# Patient Record
Sex: Female | Born: 1955 | Race: White | Hispanic: No | Marital: Single | Smoking: Former smoker
Health system: Southern US, Community
[De-identification: ages and names within clinical notes are randomized; demographics above are authoritative.]

## PROBLEM LIST (undated history)

## (undated) DIAGNOSIS — E559 Vitamin D deficiency, unspecified: Secondary | ICD-10-CM

## (undated) DIAGNOSIS — R918 Other nonspecific abnormal finding of lung field: Secondary | ICD-10-CM

## (undated) DIAGNOSIS — E785 Hyperlipidemia, unspecified: Secondary | ICD-10-CM

## (undated) DIAGNOSIS — M25511 Pain in right shoulder: Secondary | ICD-10-CM

## (undated) DIAGNOSIS — I10 Essential (primary) hypertension: Secondary | ICD-10-CM

## (undated) DIAGNOSIS — B009 Herpesviral infection, unspecified: Secondary | ICD-10-CM

## (undated) DIAGNOSIS — M7551 Bursitis of right shoulder: Secondary | ICD-10-CM

## (undated) DIAGNOSIS — M7541 Impingement syndrome of right shoulder: Secondary | ICD-10-CM

## (undated) DIAGNOSIS — G5601 Carpal tunnel syndrome, right upper limb: Secondary | ICD-10-CM

## (undated) DIAGNOSIS — F17210 Nicotine dependence, cigarettes, uncomplicated: Secondary | ICD-10-CM

## (undated) DIAGNOSIS — M1711 Unilateral primary osteoarthritis, right knee: Secondary | ICD-10-CM

## (undated) DIAGNOSIS — G2581 Restless legs syndrome: Secondary | ICD-10-CM

## (undated) DIAGNOSIS — G4700 Insomnia, unspecified: Secondary | ICD-10-CM

## (undated) DIAGNOSIS — M19011 Primary osteoarthritis, right shoulder: Secondary | ICD-10-CM

## (undated) DIAGNOSIS — I2510 Atherosclerotic heart disease of native coronary artery without angina pectoris: Secondary | ICD-10-CM

## (undated) DIAGNOSIS — R768 Other specified abnormal immunological findings in serum: Secondary | ICD-10-CM

## (undated) DIAGNOSIS — M75101 Unspecified rotator cuff tear or rupture of right shoulder, not specified as traumatic: Secondary | ICD-10-CM

## (undated) DIAGNOSIS — K635 Polyp of colon: Secondary | ICD-10-CM

## (undated) HISTORY — DX: Unilateral primary osteoarthritis, right knee: M17.11

## (undated) HISTORY — DX: Restless legs syndrome: G25.81

## (undated) HISTORY — DX: Insomnia, unspecified: G47.00

## (undated) HISTORY — PX: MOUTH SURGERY: SHX715

## (undated) HISTORY — DX: Vitamin D deficiency, unspecified: E55.9

## (undated) HISTORY — DX: Primary osteoarthritis, right shoulder: M19.011

## (undated) HISTORY — DX: Carpal tunnel syndrome, right upper limb: G56.01

## (undated) HISTORY — DX: Herpesviral infection, unspecified: B00.9

## (undated) HISTORY — DX: Impingement syndrome of right shoulder: M75.41

## (undated) HISTORY — DX: Pain in right shoulder: M25.511

## (undated) HISTORY — DX: Polyp of colon: K63.5

## (undated) HISTORY — DX: Bursitis of right shoulder: M75.51

## (undated) HISTORY — PX: ABDOMINAL HYSTERECTOMY: SHX81

## (undated) HISTORY — DX: Hyperlipidemia, unspecified: E78.5

## (undated) HISTORY — DX: Essential (primary) hypertension: I10

## (undated) HISTORY — DX: Nicotine dependence, cigarettes, uncomplicated: F17.210

## (undated) HISTORY — DX: Atherosclerotic heart disease of native coronary artery without angina pectoris: I25.10

## (undated) HISTORY — DX: Other nonspecific abnormal finding of lung field: R91.8

## (undated) HISTORY — DX: Other specified abnormal immunological findings in serum: R76.8

## (undated) HISTORY — DX: Unspecified rotator cuff tear or rupture of right shoulder, not specified as traumatic: M75.101

---

## 2016-04-11 ENCOUNTER — Other Ambulatory Visit: Payer: Self-pay | Admitting: Orthopedic Surgery

## 2016-04-11 DIAGNOSIS — M7541 Impingement syndrome of right shoulder: Secondary | ICD-10-CM

## 2016-04-16 ENCOUNTER — Encounter: Payer: Self-pay | Admitting: Radiology

## 2016-04-16 ENCOUNTER — Ambulatory Visit
Admission: RE | Admit: 2016-04-16 | Discharge: 2016-04-16 | Disposition: A | Discharge status: Home / Self Care | Payer: BLUE CROSS/BLUE SHIELD | Source: Ambulatory Visit | Attending: Orthopedic Surgery | Admitting: Orthopedic Surgery

## 2016-04-16 DIAGNOSIS — M7541 Impingement syndrome of right shoulder: Secondary | ICD-10-CM

## 2017-04-19 IMAGING — MR MR SHOULDER*R* W/O CM
4 of 5 series · 19 of 40 positions shown · non-contrast
Comparison: None.

CLINICAL DATA: Right shoulder pain, weakness

EXAM:
MRI OF THE RIGHT SHOULDER WITHOUT CONTRAST
TECHNIQUE: Multiplanar, multisequence MR imaging of the shoulder was performed.
No intravenous contrast was administered.

[Series 3: T2 fat-sat · axial · 4.0mm · 0.27mm/px · z∈[+2,+56]mm · 5 of 16 slices shown (1 of 3)]
[im 1/16]
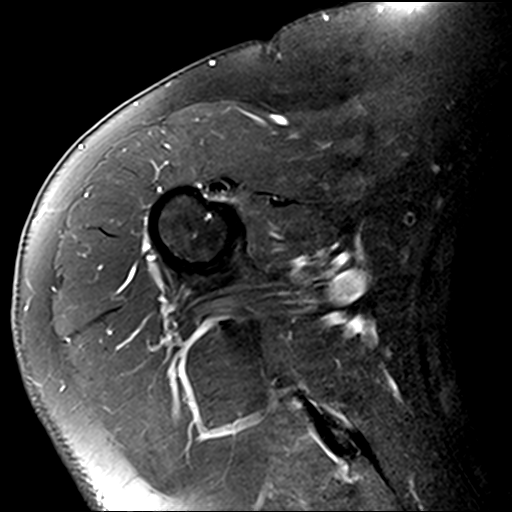
[im 3/16]
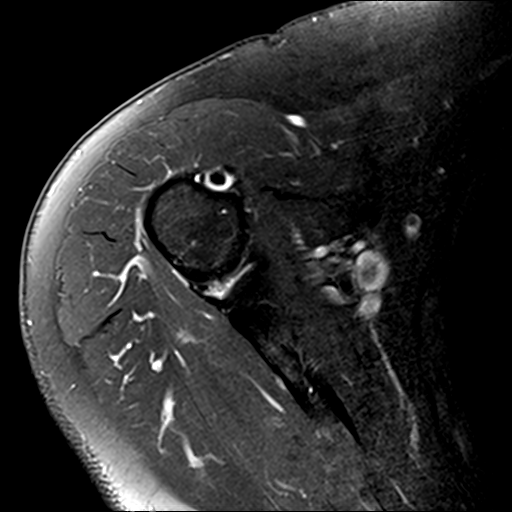
[im 5/16]
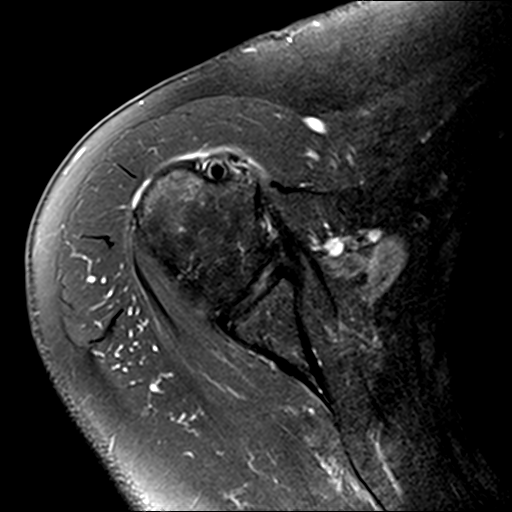
[im 9/16]
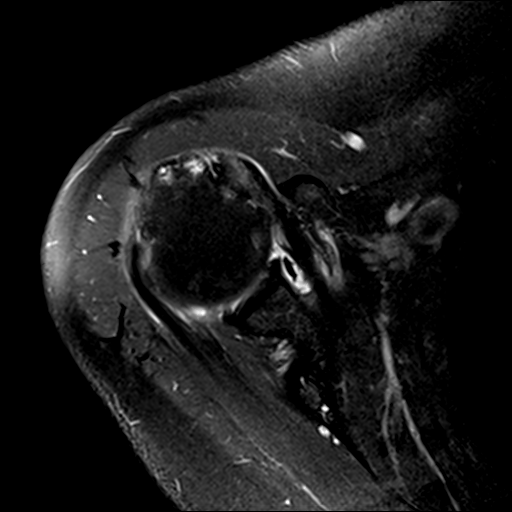
[im 13/16]
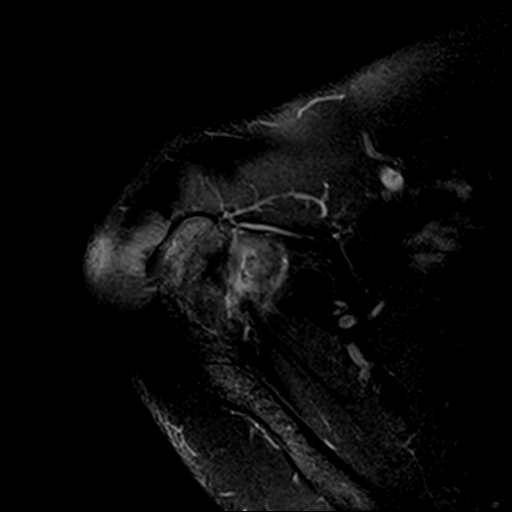

[Series 4: T2 fat-sat · oblique · 4.0mm · 0.31mm/px · 3 of 16 slices shown (2 of 3)]
[im 3/16]
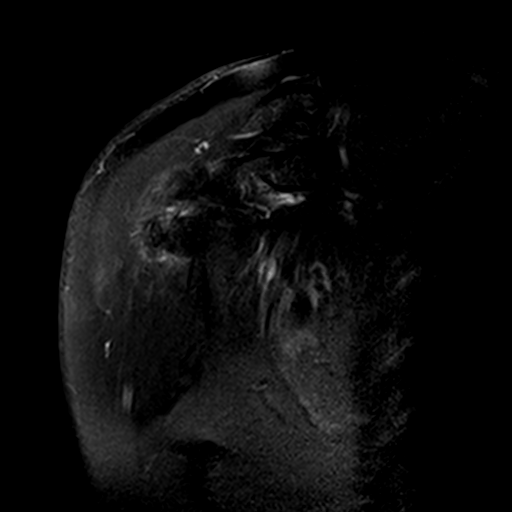
[im 9/16]
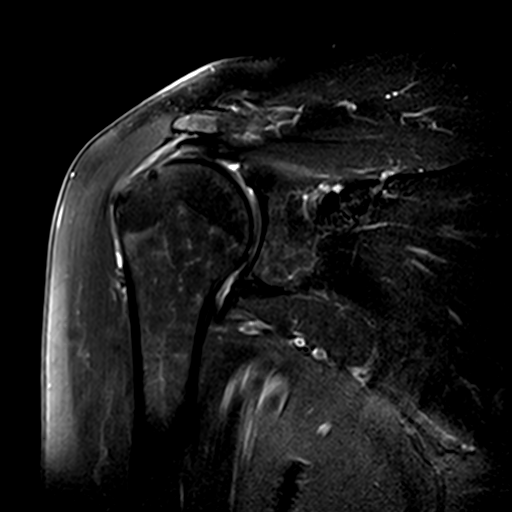
[im 13/16]
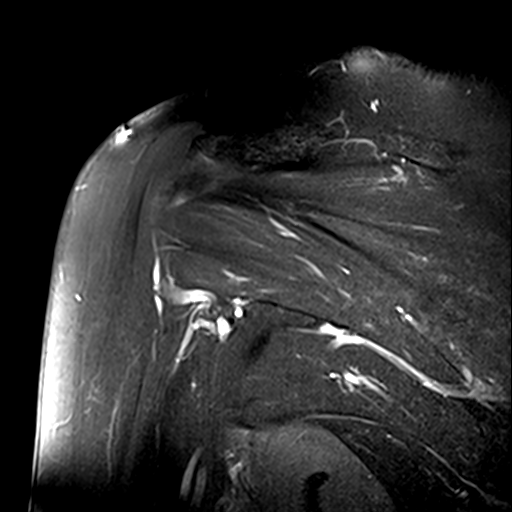

[Series 5: T2 fat-sat · coronal · 4.0mm · 0.31mm/px · 3 of 15 slices shown (3 of 3)]
[im 3/15]
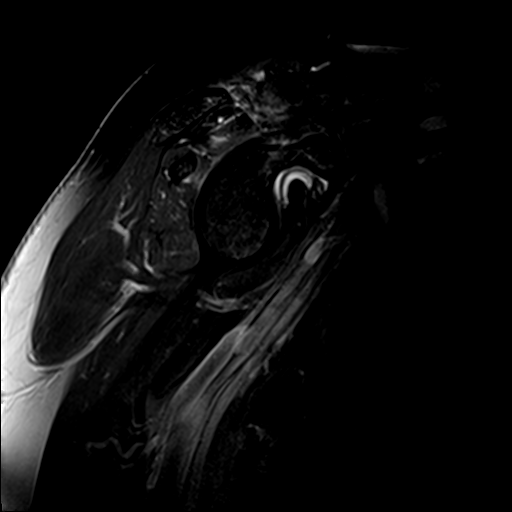
[im 9/15]
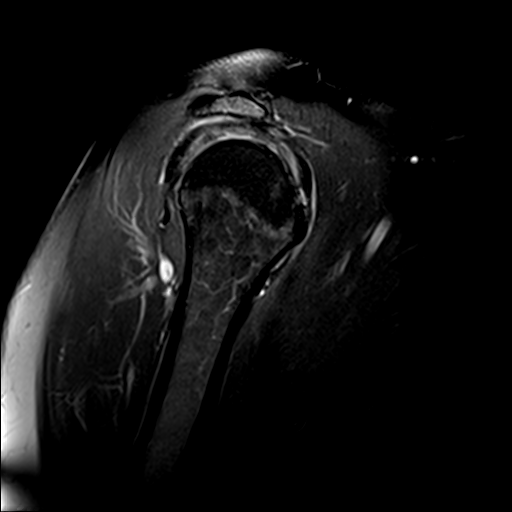
[im 13/15]
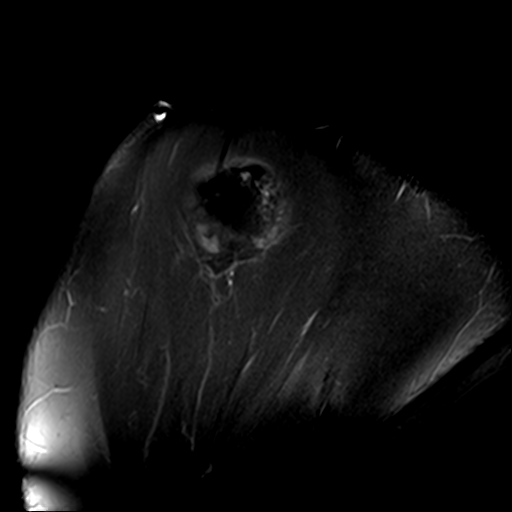

[Series 7: PD · oblique · 4.0mm · 0.27mm/px · 8 of 16 slices shown]
[im 1/16]
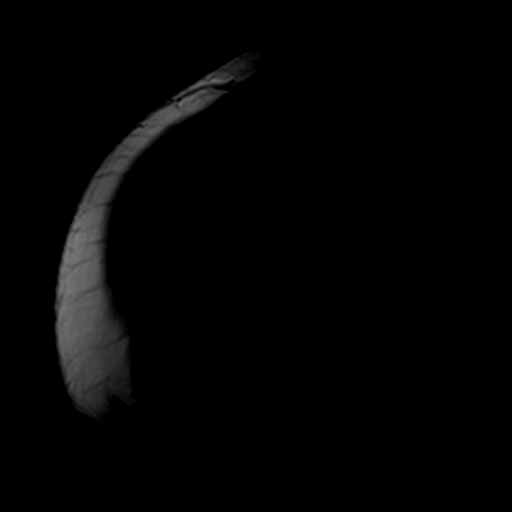
[im 3/16]
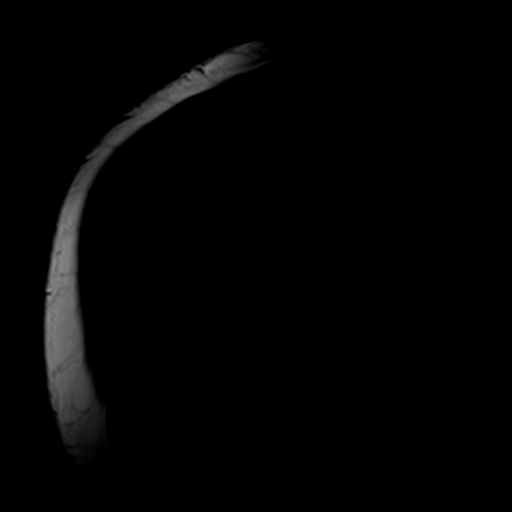
[im 5/16]
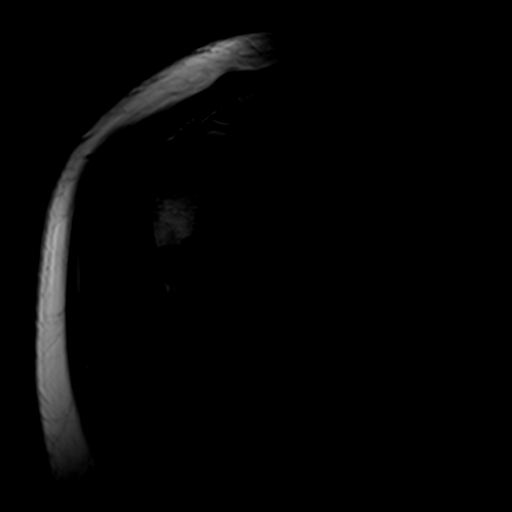
[im 7/16]
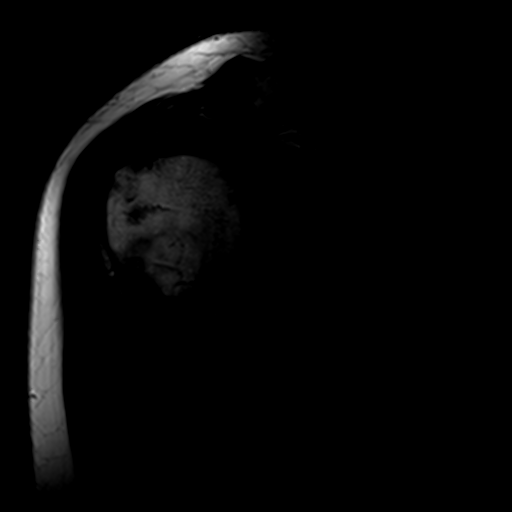
[im 9/16]
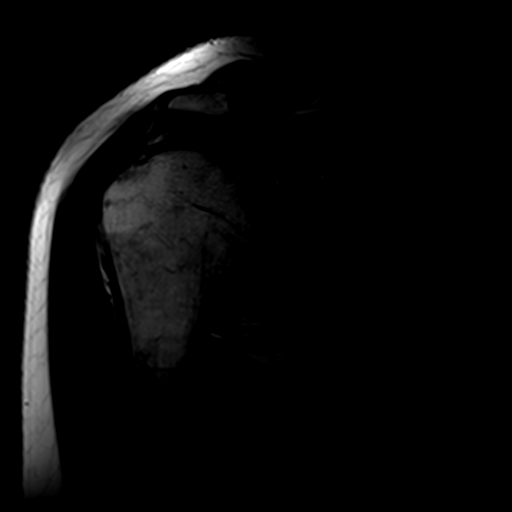
[im 11/16]
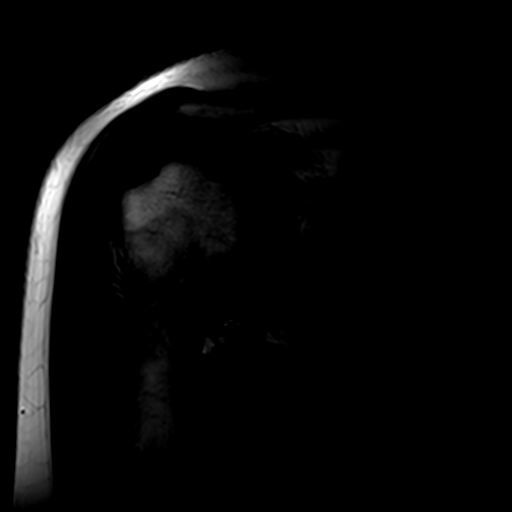
[im 13/16]
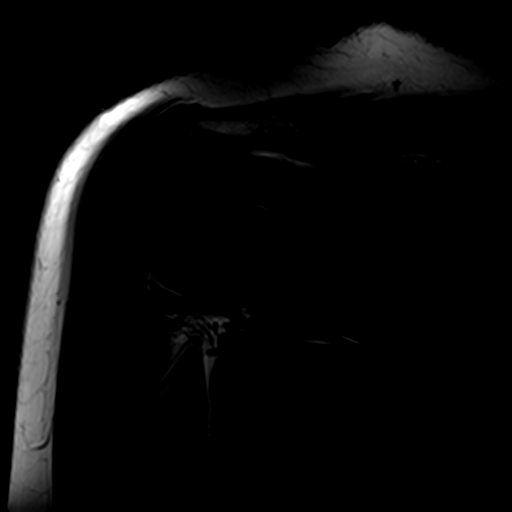
[im 16/16]
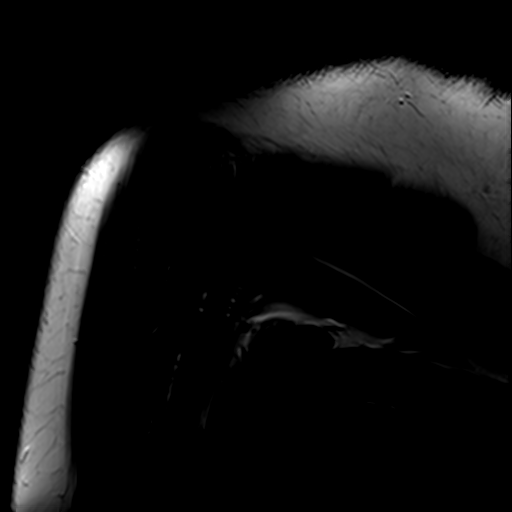

[19 of 40 positions shown; findings below may reference images not displayed]

FINDINGS: Rotator cuff: Moderate tendinosis of the supraspinatus tendon with a
large full-thickness tear measuring 2 cm in anterior- posterior
dimension with 15 mm of retraction and a few intact anterior fibers.
Moderate tendinosis of the infraspinatus tendon. Teres minor tendon
is intact. Mild tendinosis of the subscapularis tendon.

Muscles: No atrophy or fatty replacement of nor abnormal signal
within, the muscles of the rotator cuff.

Biceps long head: Moderate tendinosis of the intraarticular portion
of the long head of the biceps tendon.

Acromioclavicular Joint: Moderate arthropathy of the
acromioclavicular joint. Type I acromion.

Glenohumeral Joint: No joint effusion.  No focal chondral defect.

Labrum: Grossly intact, but evaluation is limited by lack of
intraarticular fluid.

Bones:  No marrow signal abnormality.  No fracture or dislocation.

Other: No fluid collection or hematoma.
IMPRESSION: 1. Moderate tendinosis of the supraspinatus tendon with a large
full-thickness tear measuring 2 cm in anterior- posterior dimension
with 15 mm of retraction and a few intact anterior fibers.
2. Moderate tendinosis of the infraspinatus tendon.
3. Mild tendinosis of the subscapularis tendon.
4. Moderate tendinosis of the intraarticular portion of the long
head of the biceps tendon.

## 2019-05-20 ENCOUNTER — Telehealth: Payer: Self-pay

## 2019-05-20 NOTE — Telephone Encounter (Signed)
NOTES ON FILE FROM [facility] [phone], NOTES SENT TO SCHEDULING

## 2019-06-11 NOTE — Progress Notes (Signed)
CARDIOLOGY CONSULT NOTE       Patient ID: Rachel Cooper MRN: [id number] DOB/AGE: 1955-12-17 64 y.o.  Admit date: (Not on file) Referring Physician: Self for 2 nd opinion  Primary Physician: No primary care provider on file. Primary Cardiologist: New Reason for Consultation: CAD  Active Problems:   * No active hospital problems. *   HPI:  64 y.o. referred by self  for CAD. History of chronic pain syndrome with fibromyalgia and osteoarthritis She has HLD but statins makes her pains worse History of HTN Previous smoker quitting 2019 Seen by Cardiology at [facility] 03/31/19 Has had 2 vessel PCI LAD and circumflex 03/12/19  She has been on Plavix  LDL has been in 118 range There notes indicate 3 mm x 16 mm DES Synergy to LAD /Circumflex Cath done from right radial EF normal 55% with trivial MR and no gradient across AV Ulcerative 80% plaques in proximal LAD and mid circumflex directly stented  Notes indicate only 30% residual distal RCA and LAD disease   History of pulmonary nodules first diagnosed in 2017 and stable by CT She has again held her statin ( simvastatin) to see if her back pain improved and it did not   LDL too high at 131 Triglycerides 163   Initial presentation was with bad indigestion and transferred to [facility] from [place] [facility] No recurrent symptoms since stents. Most concerned about all the meds she is taking especially DAT as she bruises easily Assured her they were all needed and discussed risk of stent thrombosis If she does not comply   ROS All other systems reviewed and negative except as noted above  Past Medical History:  Diagnosis Date  . ANA positive   . Arthritis of right acromioclavicular joint   . Bursitis of right shoulder   . CAD (coronary artery disease)   . Cigarette nicotine dependence   . Colon polyp   . Herpes simplex   . Hyperlipidemia   . Hypertension   . Insomnia   . Insomnia   . Osteoarthritis of right knee   . Pulmonary  nodules   . Right carpal tunnel syndrome   . Right shoulder pain   . RLS (restless legs syndrome)   . Shoulder impingement syndrome, right   . Tear of right rotator cuff   . Vitamin D insufficiency     Family History  Problem Relation Age of Onset  . Heart disease Mother   . Cancer Father        LUNG  . Arthritis Sister   . Fibromyalgia Sister   . Hypertension Sister     Social History   Socioeconomic History  . Marital status: Single    Spouse name: Not on file  . Number of children: Not on file  . Years of education: Not on file  . Highest education level: Not on file  Occupational History  . Not on file  Tobacco Use  . Smoking status: Former Smoker    Packs/day: 1.00    Years: 15.00    Pack years: 15.00    Types: Cigarettes  . Smokeless tobacco: Never Used  Substance and Sexual Activity  . Alcohol use: Never  . Drug use: Not on file  . Sexual activity: Not on file  Other Topics Concern  . Not on file  Social History Narrative  . Not on file   Social Determinants of Health   Financial Resource Strain:   . Difficulty of Paying Living Expenses: Not on  file  Food Insecurity:   . Worried About Programme researcher, broadcasting/film/video in the Last Year: Not on file  . Ran Out of Food in the Last Year: Not on file  Transportation Needs:   . Lack of Transportation (Medical): Not on file  . Lack of Transportation (Non-Medical): Not on file  Physical Activity:   . Days of Exercise per Week: Not on file  . Minutes of Exercise per Session: Not on file  Stress:   . Feeling of Stress : Not on file  Social Connections:   . Frequency of Communication with Friends and Family: Not on file  . Frequency of Social Gatherings with Friends and Family: Not on file  . Attends Religious Services: Not on file  . Active Member of Clubs or Organizations: Not on file  . Attends Banker Meetings: Not on file  . Marital Status: Not on file  Intimate Partner Violence:   . Fear of Current  or Ex-Partner: Not on file  . Emotionally Abused: Not on file  . Physically Abused: Not on file  . Sexually Abused: Not on file    Past Surgical History:  Procedure Laterality Date  . ABDOMINAL HYSTERECTOMY    . MOUTH SURGERY        Current Outpatient Medications:  .  aspirin EC 81 MG tablet, Take 81 mg by mouth daily., Disp: , Rfl:  .  Biotin 1000 MCG CHEW, Chew by mouth., Disp: , Rfl:  .  clopidogrel (PLAVIX) 75 MG tablet, Take 75 mg by mouth daily., Disp: , Rfl:  .  co-enzyme Q-10 30 MG capsule, Take 30 mg by mouth 3 (three) times daily., Disp: , Rfl:  .  colestipol (COLESTID) 1 g tablet, Take 1 g by mouth 2 (two) times daily., Disp: , Rfl:  .  cyanocobalamin 100 MCG tablet, Take 1 tablet by mouth daily., Disp: , Rfl:  .  cyclobenzaprine (FLEXERIL) 10 MG tablet, Take 10 mg by mouth 3 (three) times daily as needed for muscle spasms., Disp: , Rfl:  .  diclofenac Sodium (VOLTAREN) 1 % GEL, Apply 4 g topically 4 (four) times daily., Disp: , Rfl:  .  gabapentin (NEURONTIN) 300 MG capsule, Take 300 mg by mouth daily., Disp: , Rfl:  .  lisinopril (ZESTRIL) 20 MG tablet, Take 20 mg by mouth daily., Disp: , Rfl:  .  Magnesium 250 MG TABS, Take 1 tablet by mouth daily., Disp: , Rfl:  .  Melatonin 10 MG TABS, Take by mouth., Disp: , Rfl:  .  Melatonin-Pyridoxine 3-1 MG TABS, Take 1 tablet by mouth at bedtime., Disp: , Rfl:  .  metoprolol succinate (TOPROL-XL) 50 MG 24 hr tablet, Take 50 mg by mouth daily. Take with or immediately following a meal., Disp: , Rfl:  .  mupirocin ointment (BACTROBAN) 2 %, Apply 1 application topically as needed for rash., Disp: , Rfl:  .  NON FORMULARY, Take 2 tablets by mouth as needed (leg cramps). Hyland leg cramps, Disp: , Rfl:  .  pilocarpine (SALAGEN) 5 MG tablet, Take by mouth 3 (three) times daily., Disp: , Rfl:  .  Potassium (POTASSIMIN PO), Take 1 tablet by mouth daily., Disp: , Rfl:  .  rOPINIRole (REQUIP) 1 MG tablet, Take 1 mg by mouth daily., Disp: ,  Rfl:  .  simvastatin (ZOCOR) 40 MG tablet, Take 40 mg by mouth daily., Disp: , Rfl:  .  zolpidem (AMBIEN) 10 MG tablet, Take 10 mg by mouth at bedtime  as needed for sleep., Disp: , Rfl:     Physical Exam: Height 5\' 5"  (1.651 m), weight 173 lb (78.5 kg).    Affect appropriate Healthy:  appears stated age HEENT: normal Neck supple with no adenopathy JVP normal no bruits no thyromegaly Lungs clear with no wheezing and good diaphragmatic motion Heart:  S1/S2 no murmur, no rub, gallop or click PMI normal Abdomen: benighn, BS positve, no tenderness, no AAA no bruit.  No HSM or HJR Distal pulses intact with no bruits No edema Neuro non-focal Skin warm and dry No muscular weakness   Labs:  No results found for: WBC, HGB, HCT, MCV, PLT No results for input(s): NA, K, CL, CO2, BUN, CREATININE, CALCIUM, PROT, BILITOT, ALKPHOS, ALT, AST, GLUCOSE in the last 168 hours.  Invalid input(s): LABALBU No results found for: CKTOTAL, CKMB, CKMBINDEX, TROPONINI No results found for: CHOL No results found for: HDL No results found for: LDLCALC No results found for: TRIG No results found for: CHOLHDL No results found for: LDLDIRECT    Radiology: No results found.  EKG: None    ASSESSMENT AND PLAN:   1. CAD:  Post 2 vessel stenting proximal LAD/mid circumflex 03/12/19 at [facility] with normal EF and no significant RCA disease On DAT SL nitro called in  2. HLD:  F/u primary with labs target LDL 70 or less  3. Chronic Pain :  Stable on neuroontin , flexeril and requip 4. HTN:  Well controlled.  Continue current medications and low sodium Dash type diet.     Signed13/11/20 06/19/2019, 8:25 AM

## 2019-06-18 ENCOUNTER — Telehealth: Payer: Self-pay

## 2019-06-18 NOTE — Telephone Encounter (Signed)

## 2019-06-19 ENCOUNTER — Encounter: Payer: Self-pay | Admitting: Cardiovascular Disease

## 2019-06-19 ENCOUNTER — Telehealth (INDEPENDENT_AMBULATORY_CARE_PROVIDER_SITE_OTHER): Payer: 59 | Admitting: Cardiovascular Disease

## 2019-06-19 VITALS — Ht 65.0 in | Wt 173.0 lb

## 2019-06-19 DIAGNOSIS — I2510 Atherosclerotic heart disease of native coronary artery without angina pectoris: Secondary | ICD-10-CM | POA: Diagnosis not present

## 2019-06-19 MED ORDER — NITROGLYCERIN 0.4 MG SL SUBL: SUBLINGUAL_TABLET | SUBLINGUAL | 3 refills | Status: AC

## 2019-06-19 NOTE — Patient Instructions (Addendum)
Medication Instructions:  Your physician has recommended you make the following change in your medication:   1-START Nitroglycerin (NTG) Take 1 NTG, under your tongue, while sitting. If no relief of pain may repeat NTG, one tablet every 5 minutes up to 3 tablets total over 15 minutes. If no relief CALL 911. If you have dizziness/lightheadness while taking NTG, stop taking and call 911.  *If you need a refill on your cardiac medications before your next appointment, please call your pharmacy*  Lab Work: If you have labs (blood work) drawn today and your tests are completely normal, you will receive your results only by: Marland Kitchen MyChart Message (if you have MyChart) OR . A paper copy in the mail If you have any lab test that is abnormal or we need to change your treatment, we will call you to review the results.  Follow-Up: At [facility], you and your health needs are our priority.  As part of our continuing mission to provide you with exceptional heart care, we have created designated Provider Care Teams.  These Care Teams include your primary Cardiologist (physician) and Advanced Practice Providers (APPs -  Physician Assistants and Nurse Practitioners) who all work together to provide you with the care you need, when you need it.  Your next appointment:   6 month(s)  The format for your next appointment:   In Person  Provider:   You may see Dr. Eden Emms or one of the following Advanced Practice Providers on your designated Care Team:    Norma Fredrickson, NP  Nada Boozer, NP  Georgie Chard, NP
